# Patient Record
Sex: Male | Born: 2005
Health system: Southern US, Community
[De-identification: ages and names within clinical notes are randomized; demographics above are authoritative.]

---
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
## 2023-03-17 DIAGNOSIS — Z0001 Encounter for general adult medical examination with abnormal findings: Secondary | ICD-10-CM | POA: Diagnosis not present

## 2023-03-17 DIAGNOSIS — Z1331 Encounter for screening for depression: Secondary | ICD-10-CM | POA: Diagnosis not present

## 2023-03-17 DIAGNOSIS — Z713 Dietary counseling and surveillance: Secondary | ICD-10-CM | POA: Diagnosis not present

## 2023-03-17 DIAGNOSIS — Z0000 Encounter for general adult medical examination without abnormal findings: Secondary | ICD-10-CM | POA: Diagnosis not present

## 2023-03-17 DIAGNOSIS — Z68.41 Body mass index (BMI) pediatric, 5th percentile to less than 85th percentile for age: Secondary | ICD-10-CM | POA: Diagnosis not present

## 2023-03-17 DIAGNOSIS — Z113 Encounter for screening for infections with a predominantly sexual mode of transmission: Secondary | ICD-10-CM | POA: Diagnosis not present

## 2023-07-30 ENCOUNTER — Ambulatory Visit: Admitting: Sports Medicine

## 2023-11-16 DIAGNOSIS — Z130 Encounter for screening for diseases of the blood and blood-forming organs and certain disorders involving the immune mechanism: Secondary | ICD-10-CM | POA: Diagnosis not present

## 2023-11-16 DIAGNOSIS — Z13220 Encounter for screening for lipoid disorders: Secondary | ICD-10-CM | POA: Diagnosis not present

## 2023-11-16 DIAGNOSIS — Z1159 Encounter for screening for other viral diseases: Secondary | ICD-10-CM | POA: Diagnosis not present

## 2023-11-16 DIAGNOSIS — Z0189 Encounter for other specified special examinations: Secondary | ICD-10-CM | POA: Diagnosis not present

## 2023-11-16 DIAGNOSIS — Z131 Encounter for screening for diabetes mellitus: Secondary | ICD-10-CM | POA: Diagnosis not present

## 2024-02-09 ENCOUNTER — Telehealth (INDEPENDENT_AMBULATORY_CARE_PROVIDER_SITE_OTHER): Payer: Self-pay

## 2024-02-09 NOTE — Telephone Encounter (Signed)
 Patient's mother left voicemail regarding patient having nosebleeds. Patient is an old [facility] patient and is wanting to schedule a possible visit. Mother wanted to know which side of the nose was cauterized before. Please advise.

## 2024-02-21 ENCOUNTER — Telehealth (INDEPENDENT_AMBULATORY_CARE_PROVIDER_SITE_OTHER): Payer: Self-pay | Admitting: Otolaryngology

## 2024-02-21 NOTE — Telephone Encounter (Signed)
 The patient's mother called in requesting an appointment for the patient for nose bleeds.  I returned her call to let her know he has not seen Dr Karis in over 3 years and will need a referral first and then we would be able to get him scheduled.

## 2024-03-14 ENCOUNTER — Encounter (INDEPENDENT_AMBULATORY_CARE_PROVIDER_SITE_OTHER): Payer: Self-pay | Admitting: Otolaryngology

## 2024-03-14 ENCOUNTER — Ambulatory Visit (INDEPENDENT_AMBULATORY_CARE_PROVIDER_SITE_OTHER): Admitting: Otolaryngology

## 2024-03-14 VITALS — Temp 97.5°F | Ht 71.0 in | Wt 165.0 lb

## 2024-03-14 DIAGNOSIS — R040 Epistaxis: Secondary | ICD-10-CM | POA: Diagnosis not present

## 2024-03-14 NOTE — [place] Notes (Signed)
 CC: Recurrent epistaxis  Discussed the use of AI scribe software for clinical note transcription with the patient, who gave verbal consent to proceed.  History of Present Illness Zachary Mills is a 18 year old male who presents with recurrent epistaxis.  He has experienced a significant increase in epistaxis over the past two weeks, with the worst week involving approximately twenty episodes. The bleeding primarily originates from the left nostril, which was previously cauterized in 2021, but he has also noticed some blood in the mucus from the right nostril.  The episodes have been less severe than previous ones but occur frequently, such as when he is in the shower. The severity of the epistaxis has increased since before Thanksgiving this year, whereas last year he only experienced minor issues with the change of seasons.  No recent trauma to the face, use of blood thinners, or known allergies. He is not using any nasal medications or sprays. He mentions that using a humidifier in his dorm made a noticeable difference in managing his symptoms.  He is currently a consulting civil engineer at [employer], located [place], and he has been home for only thirty minutes prior to this appointment.   History reviewed. No pertinent past medical history.  History reviewed. No pertinent surgical history.  History reviewed. No pertinent family history.  Social History:  reports that he has never smoked. He has never used smokeless tobacco. No history on file for alcohol use and drug use.  Allergies: Allergies[1]  Prior to Admission medications  Medication Sig Start Date End Date Taking? Authorizing Provider  doxycycline  (MONODOX ) 100 MG capsule Take 1 capsule (100 mg total) by mouth daily. Patient not taking: Reported on 03/14/2024 11/16/22     meloxicam  (MOBIC ) 15 MG tablet Take one pill a day with food for 7 days and then prn thereafter Patient not taking: Reported on 03/14/2024 12/16/21    Arvell Evalene JONELLE, DO    Temperature (!) 97.5 F (36.4 C), temperature source Oral, height 5' 11 (1.803 m), weight 165 lb (74.8 kg), SpO2 98%. Exam: General: Communicates without difficulty, well nourished, no acute distress. Head: Normocephalic, no evidence injury, no tenderness, facial buttresses intact without stepoff. Face/sinus: No tenderness to palpation and percussion. Facial movement is normal and symmetric. Eyes: PERRL, EOMI. No scleral icterus, conjunctivae clear. Neuro: CN II exam reveals vision grossly intact.  No nystagmus at any point of gaze. Ears: Auricles well formed without lesions.  Ear canals are intact without mass or lesion.  No erythema or edema is appreciated.  The TMs are intact without fluid. Nose: External evaluation reveals normal support and skin without lesions.  Dorsum is intact.  Anterior rhinoscopy reveals hypervascular areas on the left nasal septum.  Oral:  Oral cavity and oropharynx are intact, symmetric, without erythema or edema.  Mucosa is moist without lesions. Neck: Full range of motion without pain.  There is no significant lymphadenopathy.  No masses palpable.  Thyroid bed within normal limits to palpation.  Parotid glands and submandibular glands equal bilaterally without mass.  Trachea is midline. Neuro:  CN 2-12 grossly intact.   Procedure: Extensive cauterization of the left anterior nasal septum.   Indication: To control the recurrent epistaxis.   Description:  The nasal septum is sprayed with topical xylocaine  and neo-synephrine. After adequate anesthesia is achieved, the anterior nasal septum is extensively cauterized with silver nitrate. Bleeding is noted and controlled. Multiple passes are made.  The patient tolerated the procedure well without difficulty.  Assessment & Plan Recurrent epistaxis Primarily from the left nostril, with recent episodes also from the right nostril. Increased frequency and severity since Thanksgiving, with up to 20 episodes  in a week. No trauma or anticoagulant use. Examination reveals superficial blood vessels on the septum, likely exacerbated by dry air. Previous cauterization in 2021 was effective, but new vessels have regenerated. - Cauterized the identified blood vessels in the left nostril. - Advised use of a humidifier in the bedroom to maintain moisture in the air. - Recommended applying saline gel or Vaseline to the nasal septum to keep it moist. - Instructed to pinch the nose at the identified site for 10 minutes if bleeding occurs. - Scheduled follow-up appointment in one month to assess the effectiveness of the cauterization.    Paraskevi Funez W Darcell Sabino 03/14/2024, 1:20 PM      [1] No Known Allergies

## 2024-04-14 ENCOUNTER — Ambulatory Visit (INDEPENDENT_AMBULATORY_CARE_PROVIDER_SITE_OTHER): Admitting: Otolaryngology

## 2024-04-14 ENCOUNTER — Encounter (INDEPENDENT_AMBULATORY_CARE_PROVIDER_SITE_OTHER): Payer: Self-pay | Admitting: Otolaryngology

## 2024-04-14 VITALS — HR 60 | Ht 68.0 in | Wt 168.0 lb

## 2024-04-14 DIAGNOSIS — R040 Epistaxis: Secondary | ICD-10-CM | POA: Diagnosis not present

## 2024-04-14 NOTE — Progress Notes (Signed)
 Patient ID: Zachary Mills, male   DOB: March 03, 2006, 19 y.o.   MRN: [id number]  Follow up: Recurrent epistaxis  History of Present Illness Zachary Mills is a 19 year old male with recurrent left-sided epistaxis status post recent nasal cauterization who presents for follow-up evaluation.  He underwent left-sided nasal cauterization for recurrent epistaxis 1 month ago. He experienced mild bleeding for two to three days post-procedure, which subsequently resolved. He has had no further episodes of epistaxis and reports that the area is healing well.  He denies any history of right-sided epistaxis except in cases of direct trauma. He describes the immediate post-procedure period as uncomfortable but reports doing well since then.  He uses a humidifier in his bedroom to address nasal dryness, particularly during winter months.    Exam: General: Communicates without difficulty, well nourished, no acute distress. Head: Normocephalic, no evidence injury, no tenderness, facial buttresses intact without stepoff. Face/sinus: No tenderness to palpation and percussion. Facial movement is normal and symmetric. Eyes: PERRL, EOMI. No scleral icterus, conjunctivae clear. Neuro: CN II exam reveals vision grossly intact.  No nystagmus at any point of gaze. Ears: Auricles well formed without lesions.  Ear canals are intact without mass or lesion.  No erythema or edema is appreciated.  The TMs are intact without fluid. Nose: External evaluation reveals normal support and skin without lesions.  Dorsum is intact.  Anterior rhinoscopy reveals the cauterized area to be healing well.  Oral:  Oral cavity and oropharynx are intact, symmetric, without erythema or edema.  Mucosa is moist without lesions. Neck: Full range of motion without pain.  There is no significant lymphadenopathy.  No masses palpable.  Thyroid bed within normal limits to palpation.  Parotid glands and submandibular glands equal bilaterally without mass.   Trachea is midline. Neuro:  CN 2-12 grossly intact.    Assessment and Plan Assessment & Plan Recurrent epistaxis Recurrent left-sided epistaxis, previously managed with extensive cauterization of the left anterior nasal septum. The cauterized area is healing well without active bleeding or prominent blood vessels. No evidence of right-sided epistaxis except with direct trauma.  - Examined nasal mucosa and confirmed healing of the cauterized area without active bleeding. - Advised continued use of a humidifier in the bedroom during dry winter months to prevent mucosal dryness. - Recommended application of Vaseline or saline gel (e.g., AYR gel or spray) to the nasal mucosa as needed for dryness. - Provided anticipatory guidance regarding risk of recurrence and instructed him to report any future episodes of epistaxis. - Discussed that repeat cauterization may be considered if recurrent epistaxis occurs.
